# Patient Record
Sex: Female | Born: 1980 | Race: White | Hispanic: No | Marital: Single | Smoking: Current every day smoker
Health system: Southern US, Community
[De-identification: ages and names within clinical notes are randomized; demographics above are authoritative.]

## PROBLEM LIST (undated history)

## (undated) DIAGNOSIS — D649 Anemia, unspecified: Secondary | ICD-10-CM

## (undated) DIAGNOSIS — Z5189 Encounter for other specified aftercare: Secondary | ICD-10-CM

## (undated) HISTORY — PX: TUBAL LIGATION: SHX77

---
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
## 2008-12-13 ENCOUNTER — Emergency Department (HOSPITAL_BASED_OUTPATIENT_CLINIC_OR_DEPARTMENT_OTHER): Admission: EM | Admit: 2008-12-13 | Discharge: 2008-12-13 | Payer: Self-pay | Admitting: Emergency Medicine

## 2008-12-13 ENCOUNTER — Ambulatory Visit: Payer: Self-pay | Admitting: Diagnostic Radiology

## 2009-08-11 ENCOUNTER — Encounter: Payer: Self-pay | Admitting: Emergency Medicine

## 2009-08-11 ENCOUNTER — Ambulatory Visit: Payer: Self-pay | Admitting: Diagnostic Radiology

## 2009-08-12 ENCOUNTER — Inpatient Hospital Stay (HOSPITAL_COMMUNITY): Admission: EM | Admit: 2009-08-12 | Discharge: 2009-08-16 | Payer: Self-pay | Admitting: Internal Medicine

## 2010-10-03 LAB — BASIC METABOLIC PANEL
CO2: 22 mEq/L (ref 19–32)
Calcium: 8.4 mg/dL (ref 8.4–10.5)
GFR calc Af Amer: >60 mL/min (ref >60)
GFR calc non Af Amer: 54 mL/min — ABNORMAL LOW (ref >60)
Glucose, Bld: 174 mg/dL — ABNORMAL HIGH (ref 70–99)

## 2010-10-03 LAB — CBC
HCT: 32.9 % — ABNORMAL LOW (ref 36.0–46.0)
Hemoglobin: 11.2 g/dL — ABNORMAL LOW (ref 12.0–15.0)
Platelets: 192 10*3/uL (ref 150–400)
RBC: 3.87 MIL/uL (ref 3.87–5.11)
WBC: 15 10*3/uL — ABNORMAL HIGH (ref 4.0–10.5)

## 2010-10-03 LAB — DIFFERENTIAL
Basophils Relative: 0 % (ref 0–1)
Eosinophils Absolute: 0 10*3/uL (ref 0.0–0.7)
Monocytes Absolute: 0.8 10*3/uL (ref 0.1–1.0)
Neutro Abs: 13.4 10*3/uL — ABNORMAL HIGH (ref 1.7–7.7)

## 2010-10-03 LAB — URINALYSIS, ROUTINE W REFLEX MICROSCOPIC
Glucose, UA: 100 mg/dL — AB
Ketones, ur: NEGATIVE mg/dL
Protein, ur: 100 mg/dL — AB
pH: 6 (ref 5.0–8.0)

## 2010-10-03 LAB — PREGNANCY, URINE: Preg Test, Ur: NEGATIVE

## 2010-10-03 LAB — URINE MICROSCOPIC-ADD ON

## 2010-10-04 LAB — COMPREHENSIVE METABOLIC PANEL
ALT: 13 U/L (ref 0–35)
ALT: 14 U/L (ref 0–35)
AST: 15 U/L (ref 0–37)
Alkaline Phosphatase: 63 U/L (ref 39–117)
BUN: 5 mg/dL — ABNORMAL LOW (ref 6–23)
Calcium: 8.2 mg/dL — ABNORMAL LOW (ref 8.4–10.5)
Chloride: 107 mEq/L (ref 96–112)
Chloride: 109 mEq/L (ref 96–112)
Creatinine, Ser: 0.65 mg/dL (ref 0.4–1.2)
Creatinine, Ser: 0.68 mg/dL (ref 0.4–1.2)
GFR calc Af Amer: >60 mL/min (ref >60)
GFR calc non Af Amer: >60 mL/min (ref >60)
Glucose, Bld: 87 mg/dL (ref 70–99)
Potassium: 2.8 mEq/L — ABNORMAL LOW (ref 3.5–5.1)
Potassium: 4.1 mEq/L (ref 3.5–5.1)
Total Protein: 5.7 g/dL — ABNORMAL LOW (ref 6.0–8.3)

## 2010-10-04 LAB — PHOSPHORUS
Phosphorus: 1.7 mg/dL — ABNORMAL LOW (ref 2.3–4.6)
Phosphorus: 3.4 mg/dL (ref 2.3–4.6)

## 2010-10-04 LAB — CBC
HCT: 28.1 % — ABNORMAL LOW (ref 36.0–46.0)
Hemoglobin: 9.1 g/dL — ABNORMAL LOW (ref 12.0–15.0)
RBC: 3.25 MIL/uL — ABNORMAL LOW (ref 3.87–5.11)
RDW: 15.6 % — ABNORMAL HIGH (ref 11.5–15.5)

## 2010-10-04 LAB — BASIC METABOLIC PANEL
CO2: 21 mEq/L (ref 19–32)
Calcium: 7.9 mg/dL — ABNORMAL LOW (ref 8.4–10.5)
Creatinine, Ser: 0.71 mg/dL (ref 0.4–1.2)

## 2010-10-04 LAB — MAGNESIUM: Magnesium: 1.9 mg/dL (ref 1.5–2.5)

## 2010-10-04 LAB — CULTURE, RESPIRATORY W GRAM STAIN

## 2010-10-04 LAB — CULTURE, BLOOD (ROUTINE X 2)

## 2010-10-04 LAB — EXPECTORATED SPUTUM ASSESSMENT W GRAM STAIN, RFLX TO RESP C

## 2010-10-26 LAB — DIFFERENTIAL
Eosinophils Absolute: 0.1 10*3/uL (ref 0.0–0.7)
Lymphocytes Relative: 36 % (ref 12–46)
Lymphs Abs: 2.6 10*3/uL (ref 0.7–4.0)
Monocytes Absolute: 0.7 10*3/uL (ref 0.1–1.0)
Neutro Abs: 3.8 10*3/uL (ref 1.7–7.7)
Neutrophils Relative %: 52 % (ref 43–77)

## 2010-10-26 LAB — BASIC METABOLIC PANEL
BUN: 12 mg/dL (ref 6–23)
Calcium: 8.7 mg/dL (ref 8.4–10.5)
Chloride: 107 mEq/L (ref 96–112)
GFR calc Af Amer: >60 mL/min (ref >60)
GFR calc non Af Amer: >60 mL/min (ref >60)

## 2010-10-26 LAB — CBC
Hemoglobin: 10.8 g/dL — ABNORMAL LOW (ref 12.0–15.0)
MCV: 85.9 fL (ref 78.0–100.0)
RDW: 16 % — ABNORMAL HIGH (ref 11.5–15.5)
WBC: 7.4 10*3/uL (ref 4.0–10.5)

## 2010-10-26 LAB — POCT CARDIAC MARKERS

## 2015-12-13 ENCOUNTER — Emergency Department (HOSPITAL_COMMUNITY)
Admission: EM | Admit: 2015-12-13 | Discharge: 2015-12-13 | Disposition: A | Discharge status: Home / Self Care | Payer: Medicaid Other | Attending: Emergency Medicine | Admitting: Emergency Medicine

## 2015-12-13 ENCOUNTER — Encounter (HOSPITAL_COMMUNITY): Payer: Self-pay | Admitting: *Deleted

## 2015-12-13 ENCOUNTER — Observation Stay (HOSPITAL_COMMUNITY)
Admission: AD | Admit: 2015-12-13 | Discharge: 2015-12-14 | Disposition: A | Discharge status: Home / Self Care | Payer: Medicaid Other | Attending: Medical | Admitting: Medical

## 2015-12-13 ENCOUNTER — Emergency Department (HOSPITAL_COMMUNITY): Payer: Medicaid Other

## 2015-12-13 ENCOUNTER — Encounter (HOSPITAL_COMMUNITY): Payer: Self-pay

## 2015-12-13 DIAGNOSIS — M25551 Pain in right hip: Secondary | ICD-10-CM

## 2015-12-13 DIAGNOSIS — Y999 Unspecified external cause status: Secondary | ICD-10-CM | POA: Insufficient documentation

## 2015-12-13 DIAGNOSIS — G8929 Other chronic pain: Secondary | ICD-10-CM | POA: Diagnosis not present

## 2015-12-13 DIAGNOSIS — F17210 Nicotine dependence, cigarettes, uncomplicated: Secondary | ICD-10-CM | POA: Insufficient documentation

## 2015-12-13 DIAGNOSIS — W19XXXA Unspecified fall, initial encounter: Secondary | ICD-10-CM

## 2015-12-13 DIAGNOSIS — Y939 Activity, unspecified: Secondary | ICD-10-CM | POA: Diagnosis not present

## 2015-12-13 DIAGNOSIS — F1414 Cocaine abuse with cocaine-induced mood disorder: Secondary | ICD-10-CM | POA: Diagnosis not present

## 2015-12-13 DIAGNOSIS — F411 Generalized anxiety disorder: Secondary | ICD-10-CM | POA: Insufficient documentation

## 2015-12-13 DIAGNOSIS — M545 Low back pain: Secondary | ICD-10-CM | POA: Diagnosis not present

## 2015-12-13 DIAGNOSIS — F1114 Opioid abuse with opioid-induced mood disorder: Secondary | ICD-10-CM | POA: Insufficient documentation

## 2015-12-13 DIAGNOSIS — W010XXA Fall on same level from slipping, tripping and stumbling without subsequent striking against object, initial encounter: Secondary | ICD-10-CM | POA: Diagnosis not present

## 2015-12-13 DIAGNOSIS — Y92521 Bus station as the place of occurrence of the external cause: Secondary | ICD-10-CM | POA: Diagnosis not present

## 2015-12-13 DIAGNOSIS — R45851 Suicidal ideations: Secondary | ICD-10-CM | POA: Diagnosis present

## 2015-12-13 DIAGNOSIS — F1994 Other psychoactive substance use, unspecified with psychoactive substance-induced mood disorder: Secondary | ICD-10-CM | POA: Diagnosis present

## 2015-12-13 HISTORY — DX: Anemia, unspecified: D64.9

## 2015-12-13 HISTORY — DX: Encounter for other specified aftercare: Z51.89

## 2015-12-13 LAB — POC URINE PREG, ED: Preg Test, Ur: NEGATIVE

## 2015-12-13 MED ORDER — NAPROXEN 500 MG PO TABS: 500.0000 mg | ORAL_TABLET | Freq: Two times a day (BID) | ORAL | Status: DC

## 2015-12-13 MED ORDER — GABAPENTIN 300 MG PO CAPS
300.0000 mg | ORAL_CAPSULE | Freq: Three times a day (TID) | ORAL | Status: DC
  Administered 2015-12-13 – 2015-12-14 (×3): 300 mg via ORAL
  Filled 2015-12-13 (×3): qty 1

## 2015-12-13 MED ORDER — TRAZODONE HCL 100 MG PO TABS
100.0000 mg | ORAL_TABLET | Freq: Every day | ORAL | Status: DC
  Administered 2015-12-13: 100 mg via ORAL
  Filled 2015-12-13: qty 1

## 2015-12-13 MED ORDER — HYDROXYZINE HCL 25 MG PO TABS
25.0000 mg | ORAL_TABLET | Freq: Four times a day (QID) | ORAL | Status: DC | PRN
  Administered 2015-12-13: 25 mg via ORAL
  Filled 2015-12-13: qty 1

## 2015-12-13 MED ORDER — HYDROCODONE-ACETAMINOPHEN 5-325 MG PO TABS
1.0000 | ORAL_TABLET | Freq: Once | ORAL | Status: AC
  Administered 2015-12-13: 1 via ORAL
  Filled 2015-12-13: qty 1
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
## 2015-12-13 NOTE — ED Notes (Signed)
PT DISCHARGED. INSTRUCTIONS AND PRESCRIPTION GIVEN. AAOX4. PT IN NO APPARENT DISTRESS. THE OPPORTUNITY TO ASK QUESTIONS WAS PROVIDED. 

## 2015-12-13 NOTE — ED Notes (Signed)
Bed: [id number] Expected date: 12/13/15 Expected time: 12:44 PM Means of arrival: Ambulance Comments: 35 yo Slip, fall, hip pain, ambulatory on scene
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
## 2015-12-13 NOTE — Discharge Instructions (Signed)
Cryotherapy °Cryotherapy means treatment with cold. Ice or gel packs can be used to reduce both pain and swelling. Ice is the most helpful within the first 24 to 48 hours after an injury or flare-up from overusing a muscle or joint. Sprains, strains, spasms, burning pain, shooting pain, and aches can all be eased with ice. Ice can also be used when recovering from surgery. Ice is effective, has very few side effects, and is safe for most people to use. °PRECAUTIONS  °Ice is not a safe treatment option for people with: °· Raynaud phenomenon. This is a condition affecting small blood vessels in the extremities. Exposure to cold may cause your problems to return. °· Cold hypersensitivity. There are many forms of cold hypersensitivity, including: °¨ Cold urticaria. Red, itchy hives appear on the skin when the tissues begin to warm after being iced. °¨ Cold erythema. This is a red, itchy rash caused by exposure to cold. °¨ Cold hemoglobinuria. Red blood cells break down when the tissues begin to warm after being iced. The hemoglobin that carry oxygen are passed into the urine because they cannot combine with blood proteins fast enough. °· Numbness or altered sensitivity in the area being iced. °If you have any of the following conditions, do not use ice until you have discussed cryotherapy with your caregiver: °· Heart conditions, such as arrhythmia, angina, or chronic heart disease. °· High blood pressure. °· Healing wounds or open skin in the area being iced. °· Current infections. °· Rheumatoid arthritis. °· Poor circulation. °· Diabetes. °Ice slows the blood flow in the region it is applied. This is beneficial when trying to stop inflamed tissues from spreading irritating chemicals to surrounding tissues. However, if you expose your skin to cold temperatures for too long or without the proper protection, you can damage your skin or nerves. Watch for signs of skin damage due to cold. °HOME CARE INSTRUCTIONS °Follow  these tips to use ice and cold packs safely. °· Place a dry or damp towel between the ice and skin. A damp towel will cool the skin more quickly, so you may need to shorten the time that the ice is used. °· For a more rapid response, add gentle compression to the ice. °· Ice for no more than 10 to 20 minutes at a time. The bonier the area you are icing, the less time it will take to get the benefits of ice. °· Check your skin after 5 minutes to make sure there are no signs of a poor response to cold or skin damage. °· Rest 20 minutes or more between uses. °· Once your skin is numb, you can end your treatment. You can test numbness by very lightly touching your skin. The touch should be so light that you do not see the skin dimple from the pressure of your fingertip. When using ice, most people will feel these normal sensations in this order: cold, burning, aching, and numbness. °· Do not use ice on someone who cannot communicate their responses to pain, such as small children or people with dementia. °HOW TO MAKE AN ICE PACK °Ice packs are the most common way to use ice therapy. Other methods include ice massage, ice baths, and cryosprays. Muscle creams that cause a cold, tingly feeling do not offer the same benefits that ice offers and should not be used as a substitute unless recommended by your caregiver. °To make an ice pack, do one of the following: °· Place crushed ice or a   bag of frozen vegetables in a sealable plastic bag. Squeeze out the excess air. Place this bag inside another plastic bag. Slide the bag into a pillowcase or place a damp towel between your skin and the bag.  Mix 3 parts water with 1 part rubbing alcohol. Freeze the mixture in a sealable plastic bag. When you remove the mixture from the freezer, it will be slushy. Squeeze out the excess air. Place this bag inside another plastic bag. Slide the bag into a pillowcase or place a damp towel between your skin and the bag. SEEK MEDICAL CARE  IF:  You develop white spots on your skin. This may give the skin a blotchy (mottled) appearance.  Your skin turns blue or pale.  Your skin becomes waxy or hard.  Your swelling gets worse. MAKE SURE YOU:   Understand these instructions.  Will watch your condition.  Will get help right away if you are not doing well or get worse.   This information is not intended to replace advice given to you by your health care provider. Make sure you discuss any questions you have with your health care provider.   Document Released: 02/28/2011 Document Revised: 07/25/2014 Document Reviewed: 02/28/2011 Elsevier Interactive Patient Education 2016 Else[employer]  Hip Pain Your hip is the joint between your upper legs and your lower pelvis. The bones, cartilage, tendons, and muscles of your hip joint perform a lot of work each day supporting your body weight and allowing you to move around. Hip pain can range from a minor ache to severe pain in one or both of your hips. Pain may be felt on the inside of the hip joint near the groin, or the outside near the buttocks and upper thigh. You may have swelling or stiffness as well.  HOME CARE INSTRUCTIONS   Take medicines only as directed by your health care provider.  Apply ice to the injured area:  Put ice in a plastic bag.  Place a towel between your skin and the bag.  Leave the ice on for 15-20 minutes at a time, 3-4 times a day.  Keep your leg raised (elevated) when possible to lessen swelling.  Avoid activities that cause pain.  Follow specific exercises as directed by your health care provider.  Sleep with a pillow between your legs on your most comfortable side.  Record how often you have hip pain, the location of the pain, and what it feels like. SEEK MEDICAL CARE IF:   You are unable to put weight on your leg.  Your hip is red or swollen or very tender to touch.  Your pain or swelling continues or worsens after 1 week.  You  have increasing difficulty walking.  You have a fever. SEEK IMMEDIATE MEDICAL CARE IF:   You have fallen.  You have a sudden increase in pain and swelling in your hip. MAKE SURE YOU:   Understand these instructions.  Will watch your condition.  Will get help right away if you are not doing well or get worse.   This information is not intended to replace advice given to you by your health care provider. Make sure you discuss any questions you have with your health care provider.   Follow up with your primary care provider if your symptoms do not improve. Apply ice to affected area. Take Naprosyn or ibuprofen as needed for pain. Return to the ED if you experience severe worsening of your symptoms, weakness, fevers, chill, redness or swelling around  your hip, bowel or bladder incontinence, numbness/tingling in both of your lower extremities.
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
## 2015-12-14 DIAGNOSIS — F1414 Cocaine abuse with cocaine-induced mood disorder: Secondary | ICD-10-CM | POA: Diagnosis not present

## 2015-12-14 DIAGNOSIS — F1994 Other psychoactive substance use, unspecified with psychoactive substance-induced mood disorder: Secondary | ICD-10-CM | POA: Diagnosis not present

## 2015-12-14 LAB — CBC
HCT: 33.2 % — ABNORMAL LOW (ref 36.0–46.0)
Hemoglobin: 10.8 g/dL — ABNORMAL LOW (ref 12.0–15.0)
MCH: 27.8 pg (ref 26.0–34.0)
MCHC: 32.5 g/dL (ref 30.0–36.0)
MCV: 85.6 fL (ref 78.0–100.0)
PLATELETS: 341 10*3/uL (ref 150–400)
RBC: 3.88 MIL/uL (ref 3.87–5.11)
RDW: 16.3 % — AB (ref 11.5–15.5)
WBC: 5.8 10*3/uL (ref 4.0–10.5)

## 2015-12-14 LAB — COMPREHENSIVE METABOLIC PANEL
ALT: 753 U/L — AB (ref 14–54)
AST: 685 U/L — ABNORMAL HIGH (ref 15–41)
Albumin: 3.1 g/dL — ABNORMAL LOW (ref 3.5–5.0)
Alkaline Phosphatase: 245 U/L — ABNORMAL HIGH (ref 38–126)
Anion gap: 7 (ref 5–15)
BUN: 18 mg/dL (ref 6–20)
CHLORIDE: 105 mmol/L (ref 101–111)
CO2: 27 mmol/L (ref 22–32)
CREATININE: 0.87 mg/dL (ref 0.44–1.00)
Calcium: 8.7 mg/dL — ABNORMAL LOW (ref 8.9–10.3)
Glucose, Bld: 139 mg/dL — ABNORMAL HIGH (ref 65–99)
POTASSIUM: 4.1 mmol/L (ref 3.5–5.1)
SODIUM: 139 mmol/L (ref 135–145)
Total Bilirubin: 0.4 mg/dL (ref 0.3–1.2)
Total Protein: 6.4 g/dL — ABNORMAL LOW (ref 6.5–8.1)

## 2015-12-14 MED ORDER — HYDROXYZINE HCL 25 MG PO TABS: 25.0000 mg | ORAL_TABLET | Freq: Four times a day (QID) | ORAL | Status: AC | PRN

## 2015-12-14 MED ORDER — GABAPENTIN 300 MG PO CAPS: 300.0000 mg | ORAL_CAPSULE | Freq: Three times a day (TID) | ORAL | Status: AC

## 2015-12-14 NOTE — Progress Notes (Addendum)
Discharge note:  Patient discharged home per provider instructions.  Patient was given follow up information.  Reviewed AVS/discharge instructions with patient and she indicated understanding.  Patient denies SI/HI/AVH.  [place] [facility] [place].  Patient was also given prescriptions of her medications.  Patient received all personal belongings from locker.  She left ambulatory with cab voucher.

## 2015-12-14 NOTE — Progress Notes (Signed)
This Clinical research associatewriter called and [facility] who also spoke with the patient and will call back at in 15 minutes.      Maryelizabeth Rowanressa Elex Mainwaring, MSW, Clare CharonLCSW, LCAS [facility] Triage Specialist [phone]

## 2015-12-14 NOTE — Progress Notes (Signed)
Patient was served lunch and satisfied.   Buel Molder, MSW, LCSW, LCAS [facility] Triage Specialist [phone] 

## 2015-12-14 NOTE — Progress Notes (Addendum)
This Clinical research associatewriter spoke with the patient to collect collateral information and identify needed resources for a therapeutic disposition.  She reports fleeting suicidal thoughts without a plan and denies homicidal ideations.  Patient denies auditory/visual hallucinations and other self-injurious behaviors.  Patient reports currently wanting to participate with inpatient substance abuse treatment.  She reports her substance use includes: Alcohol- daily, an estimated 12 or more beers, and last drank 2 days ago.  She reports crack cocaine use, an estimated $60 worth a couple times a week, and last using a couple days ago.  She reports heroin use, a various amount, route is injected and last used one month ago.  Patient reports a willingness to following up with [facility] once discharged back home for outpatient services.   This Clinical research associatewriter will make attempts at the following facilities for SA placement.    [facility]-no answer but contact information was given to patient to follow up [facility]- no answer but information was given to patient to follow up  [facility]- spoke with Joni ReiningNicole in intake who confirms availability but labs are needed.  Pt labs was started  [facility]- no answer   **This Clinical research associatewriter was informed that the patient was recently discharged from [facility] where she stayed for 10 days.  Patient has already made contact with [facility] and was instructed to call today at 12pm to check for availability.  Patient reports her intentions [facility] [place] [facility].     Maryelizabeth Rowanressa Mykelle Cockerell, MSW, Clare CharonLCSW, LCAS [facility] Triage Specialist [phone]
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
## 2015-12-14 NOTE — H&P (Signed)
[facility] Provider Admission PAA/H&P  Patient Identification: Tammy Crawford MRN:  [id number] Date of Evaluation:  12/14/2015 Chief Complaint:  SUBSTANCE INDUCED MOOD DISORDER Principal Diagnosis: Substance induced mood disorder ([place]) Diagnosis:   Patient Active Problem List   Diagnosis Date Noted  . Substance induced mood disorder [facility]) [F19.94] 12/13/2015   History of Present Illness:Tammy Crawford is an 35 y.o. female. Pt presents [facility] to [facility] for an [facility]. Per chart review, [facility] d/t slip and fall at bus station. Pt is cooperative and oriented x 4. When asked why pt didn't mention SI at [facility], pt states she felt SI was separate from the slip and fall and didn't realize the ED could've addressed her complaint there. Pt endorses SI with no plan. She reports three prior suicide attempts. She says most recent attempt was last month when she cut her L forearm with a knife. Pt sts she didn't require medical attention. Pt has old lacerations on L forearm. She reports hx of self harm. Pt says her deceased mother had bipolar disorder. Pt reports she was discharged from [facility] on 12/11/15. Pt sts she has [place] the past couple of nights and has nowhere to stay. Pt reports she drinks approximately twelve to eighteen 12 oz beers daily, and she drank 36 oz beer last night. Pt sts she used approx $65 crack cocaine 3 x weekly until last use in April 2017. She reports she injected heroin every weekend for one month starting in Feb 2017 until she accidentally overdosed on heroin in March 2017. Pt endorses fatigue, insomnia, poor appetite with unknown weight loss, guilt, worthlessness, isolating and loss of interest in usual pleasures. Pt sts she has 3 daughters (39, 6, & 39). She sts the two youngest ones live with their dad and the older daughter lives with a family friend. Pt has no upcoming court dates. She reports moderate anxiety with most recent  panic attack last Oct. She reports impairment in her short term memory. Pt has also been inpatient for substance abuse at [facility], [facility] in [place], and [facility]. Pt denies HI and denies [facility]. No delusions noted.She reports her sister in prison was addicted to crack and meth.  On Evaluation:Tammy Crawford is awake, alert and oriented X4 , found resting on obvs unit.   Denies suicidal or homicidal ideation. Denies auditory or visual hallucination and does not appear to be responding to internal stimuli. Patient reports a recent [facility]. States she relapsed 2 day after she was discharge and her aunt will not allow her to come home. Patient is requesting another residential treatment facility. Patient reports she is medication compliant without mediation side effects. States her depression 5/10 today due to current stressors. Reports good appetite and resting well.Support, encouragement and reassurance was provided.   Associated Signs/Symptoms: Depression Symptoms:  depressed mood, (Hypo) Manic Symptoms:  Distractibility, Anxiety Symptoms:  Excessive Worry, Psychotic Symptoms:  Hallucinations: None PTSD Symptoms: NA Total Time spent with patient: 30 minutes  Past Psychiatric History: See Above  Is the patient at risk to self? No.  Has the patient been a risk to self in the past 6 months? No.  Has the patient been a risk to self within the distant past? No.  Is the patient a risk to others? No.  Has the patient been a risk to others in the past 6 months? No.  Has the patient been a  risk to others within the distant past? No.   Prior Inpatient Therapy:   Prior Outpatient Therapy:    Alcohol Screening: 1. How often do you have a drink containing alcohol?: 4 or more times a week 2. How many drinks containing alcohol do you have on a typical day when you are drinking?: 10 or more 3. How often do you have six or more drinks on one occasion?: Daily or  almost daily Preliminary Score: 8 4. How often during the last year have you found that you were not able to stop drinking once you had started?: Never 5. How often during the last year have you failed to do what was normally expected from you becasue of drinking?: Less than monthly 6. How often during the last year have you needed a first drink in the morning to get yourself going after a heavy drinking session?: Never 7. How often during the last year have you had a feeling of guilt of remorse after drinking?: Never 8. How often during the last year have you been unable to remember what happened the night before because you had been drinking?: Never 9. Have you or someone else been injured as a result of your drinking?: No 10. Has a relative or friend or a doctor or another health worker been concerned about your drinking or suggested you cut down?: No Alcohol Use Disorder Identification Test Final Score (AUDIT): 13 Brief Intervention: Yes Substance Abuse History in the last 12 months:  Yes.   Consequences of Substance Abuse: NA Previous Psychotropic Medications: YES Psychological Evaluations: YES Past Medical History:  Past Medical History  Diagnosis Date  . Blood transfusion without reported diagnosis   . Anemia     Past Surgical History  Procedure Laterality Date  . Tubal ligation     Family History: History reviewed. No pertinent family history. Family Psychiatric History: See Above Tobacco Screening: _0 [phone])::1)@ Social History:  History  Alcohol Use  . Yes    Comment: OCCASIONAL     History  Drug Use  . Yes  . Special: "Crack" cocaine, Heroin    Additional Social History:    Specify valuables returned: gray necklace, pair of jeans, sneakers, sunglasses pink shirt, purple earrings                      Allergies:  No Known Allergies Lab Results:  Results for orders placed or performed during the hospital encounter of 12/13/15 (from the past 48  hour(s))  CBC     Status: Abnormal   Collection Time: 12/14/15  8:52 AM  Result Value Ref Range   WBC 5.8 4.0 - 10.5 K/uL   RBC 3.88 3.87 - 5.11 MIL/uL   Hemoglobin 10.8 (L) 12.0 - 15.0 g/dL   HCT 33.2 (L) 36.0 - 46.0 %   MCV 85.6 78.0 - 100.0 fL   MCH 27.8 26.0 - 34.0 pg   MCHC 32.5 30.0 - 36.0 g/dL   RDW 16.3 (H) 11.5 - 15.5 %   Platelets 341 150 - 400 K/uL    Comment: Performed at [facility]  Comprehensive metabolic panel     Status: Abnormal   Collection Time: 12/14/15  8:52 AM  Result Value Ref Range   Sodium 139 135 - 145 mmol/L   Potassium 4.1 3.5 - 5.1 mmol/L   Chloride 105 101 - 111 mmol/L   CO2 27 22 - 32 mmol/L   Glucose, Bld 139 (H) 65 - 99 mg/dL  BUN 18 6 - 20 mg/dL   Creatinine, Ser 0.87 0.44 - 1.00 mg/dL   Calcium 8.7 (L) 8.9 - 10.3 mg/dL   Total Protein 6.4 (L) 6.5 - 8.1 g/dL   Albumin 3.1 (L) 3.5 - 5.0 g/dL   AST 685 (H) 15 - 41 U/L   ALT 753 (H) 14 - 54 U/L   Alkaline Phosphatase 245 (H) 38 - 126 U/L   Total Bilirubin 0.4 0.3 - 1.2 mg/dL   GFR calc non Af Amer >60 >60 mL/min   GFR calc Af Amer >60 >60 mL/min    Comment: (NOTE) The eGFR has been calculated using the CKD EPI equation. This calculation has not been validated in all clinical situations. eGFR's persistently <60 mL/min signify possible Chronic Kidney Disease.    Anion gap 7 5 - 15    Comment: Performed at [facility]    Blood Alcohol level:  No results found for: [facility]  Metabolic Disorder Labs:  No results found for: HGBA1C, MPG No results found for: PROLACTIN No results found for: CHOL, TRIG, HDL, CHOLHDL, VLDL, LDLCALC  Current Medications: Current Facility-Administered Medications  Medication Dose Route Frequency Provider Last Rate Last Dose  . gabapentin (NEURONTIN) capsule 300 mg  300 mg Oral TID Hampton Abbot, MD   300 mg at 12/14/15 1157  . hydrOXYzine (ATARAX/VISTARIL) tablet 25 mg  25 mg Oral Q6H PRN Hampton Abbot, MD   25 mg at 12/13/15  2110  . traZODone (DESYREL) tablet 100 mg  100 mg Oral QHS Hampton Abbot, MD   100 mg at 12/13/15 2110   PTA Medications: Prescriptions prior to admission  Medication Sig Dispense Refill Last Dose  . gabapentin (NEURONTIN) 300 MG capsule Take 600 mg by mouth 3 (three) times daily.   Past Week at Unknown time  . lamoTRIgine (LAMICTAL) 25 MG tablet Take 75 mg by mouth 2 (two) times daily.   Past Week at Unknown time  . lurasidone (LATUDA) 40 MG TABS tablet Take 40 mg by mouth daily with breakfast.   Past Week at Unknown time  . traZODone (DESYREL) 50 MG tablet Take 50 mg by mouth at bedtime.   Past Week at Unknown time    Musculoskeletal: Strength & Muscle Tone: within normal limits Gait & Station: normal Patient leans: N/A  Psychiatric Specialty Exam: Physical Exam  Nursing note and vitals reviewed. Constitutional: She is oriented to person, place, and time. She appears well-developed.  Cardiovascular: Regular rhythm.   Musculoskeletal: Normal range of motion.  Neurological: She is alert and oriented to person, place, and time.  Psychiatric: She has a normal mood and affect. Her behavior is normal.    Review of Systems  Psychiatric/Behavioral: Negative for suicidal ideas, hallucinations and substance abuse. Depression: stable. Nervous/anxious: stable.   All other systems reviewed and are negative.   Blood pressure 110/71, pulse 90, temperature 98 F (36.7 C), temperature source Oral, resp. rate 16, height _0  (1.6 m), weight 83.008 kg (183 lb), last menstrual period 12/06/2015, SpO2 100 %.Body mass index is 32.43 kg/(m^2).   General Appearance: Casual  Eye Contact:  Good  Speech:  Clear and Coherent  Volume:  Normal  Mood:  Euthymic  Affect:  Congruent  Thought Process:  Coherent  Orientation:  Full (Time, Place, and Person)  Thought Content:  Hallucinations: None  Suicidal Thoughts:  No  Homicidal Thoughts:  No  Memory:  Immediate;   Fair Remote;   Fair  Judgement:   Fair  Insight:  Good  Psychomotor Activity:  Normal  Concentration:  Concentration: Fair  Recall:  Good  Fund of Knowledge:  Fair  Language:  Good  Akathisia:  No  Handed:  Right  AIMS (if indicated):     Assets:  Communication Skills Desire for Improvement Resilience  ADL's:  Intact  Cognition:  WNL  Sleep:        I agree with current treatment plan on 12/14/2015, Patient seen face-to-face for psychiatric evaluation follow-up, chart reviewed and case discussed with the MD Dwyane Dee and Treatment team. Reviewed the information documented and agree with the treatment plan.See discharge Note-Provided with additional resources -Patient provided Taxi voucher to [facility] in [place]   Treatment Plan Summary: Daily contact with patient to assess and evaluate symptoms and progress in treatment and Medication management Continue Neurontin 300 mg PO TID Continue Lamictal 25 mg po bid Continue Trazodone 100 MG PO QHS for insomnia   Observation Level/Precautions:  Continuous Observation Laboratory:  CBC Chemistry Profile UDS UA Labs collected this morning for TTS placement. Psychotherapy:  Psychiatry  Medications:  Continue current home medication  Consultations:   Discharge Concerns:  Safety, stabilization, and risk of access to medication and medication stabilization  Estimated LOS: Less than 48 hours Other:   Derrill [place], NP 5/29/201712:40 PM

## 2015-12-14 NOTE — Progress Notes (Signed)
[facility] INPATIENT:  Family/Significant Other Suicide Prevention Education  Suicide Prevention Education:  Patient Refusal for Family/Significant Other Suicide Prevention Education: The patient Tammy Crawford has refused to provide written consent for family/significant other to be provided Family/Significant Other Suicide Prevention Education during admission and/or prior to discharge.  Physician notified.  Glenice LaineIbekwe, Mateus Rewerts B 12/14/2015, 1:19 AM

## 2015-12-14 NOTE — Discharge Instructions (Signed)
Patient will follow up with the following agencies upon discharge:  [facility]  [address] [place], [place]   [facility]/15/2015@10 :00am  [facility] [address] [place], [place] [phone]

## 2015-12-14 NOTE — Progress Notes (Signed)
Discharge Disposition:  [facility] will [facility] [place] and follow up on 12/15/2015 @10 :30am for [facility].      Maryelizabeth Rowanressa Lesta Limbert, MSW, Clare CharonLCSW, LCAS [facility] T[facility] Specialist [phone]

## 2017-05-08 ENCOUNTER — Ambulatory Visit (HOSPITAL_COMMUNITY): Payer: Self-pay | Admitting: Psychiatry

## 2017-08-20 IMAGING — CR DG LUMBAR SPINE COMPLETE 4+V
5 series · 5 of 5 positions shown · non-contrast
Comparison: None.

CLINICAL DATA: Low back and right hip pain after [place] bathroom after slipping on water.

EXAM:
LUMBAR SPINE - COMPLETE 4+ VIEW

[t lumbar spine ap]
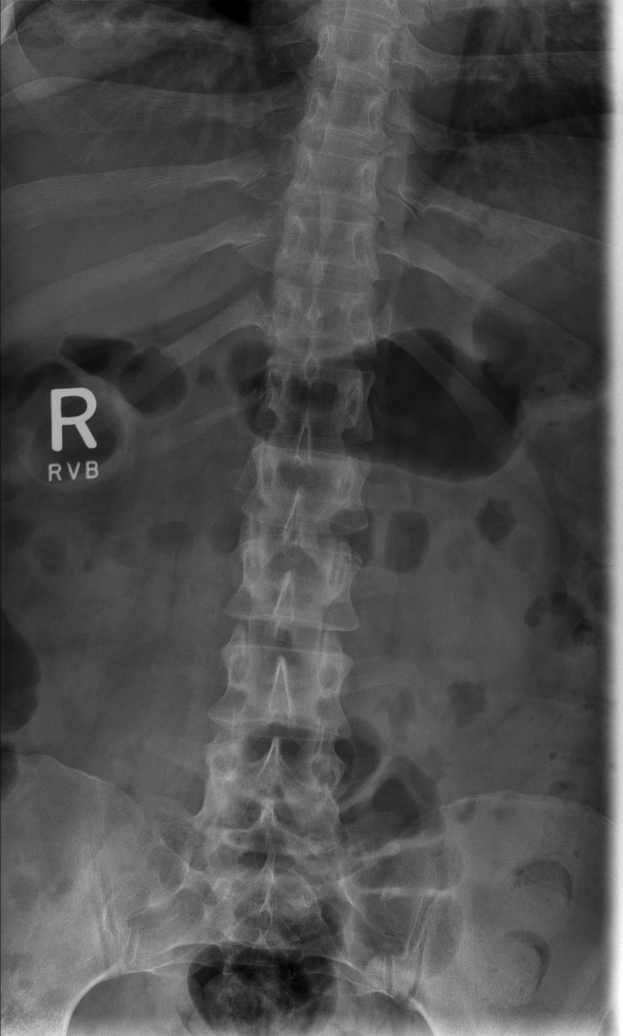

[t lumbar spine obl (1 of 2)]
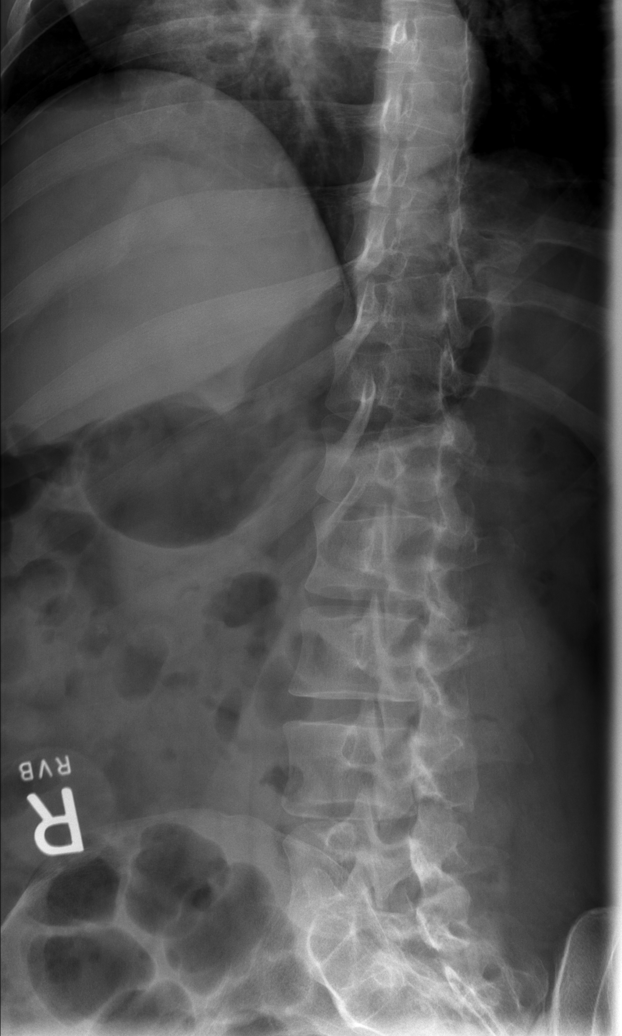

[t lumbar spine obl (2 of 2)]
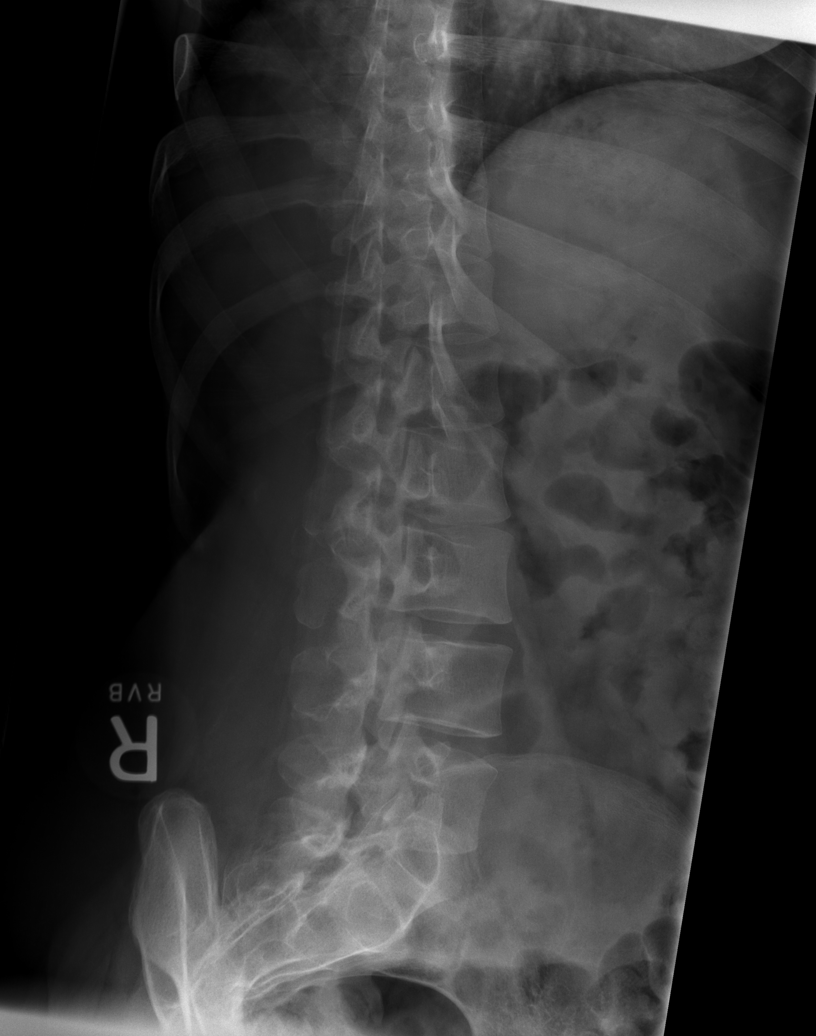

[t lumbar spine lat]
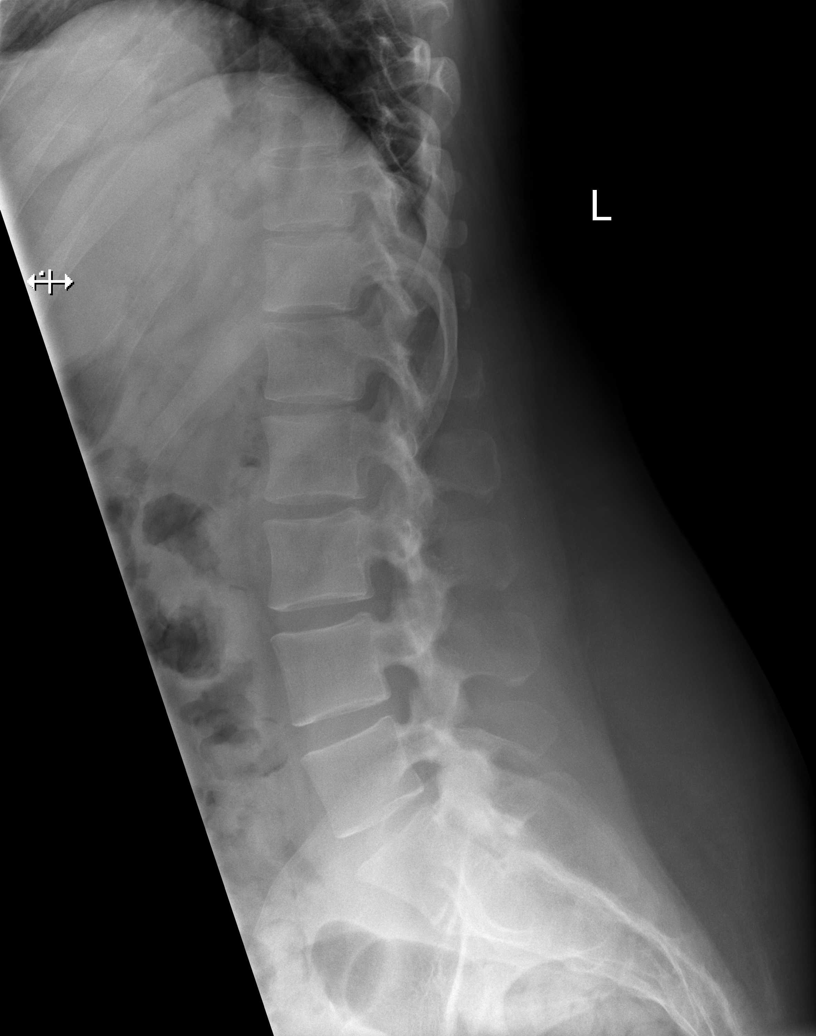

[t lumbar l-5 s-1 spot]
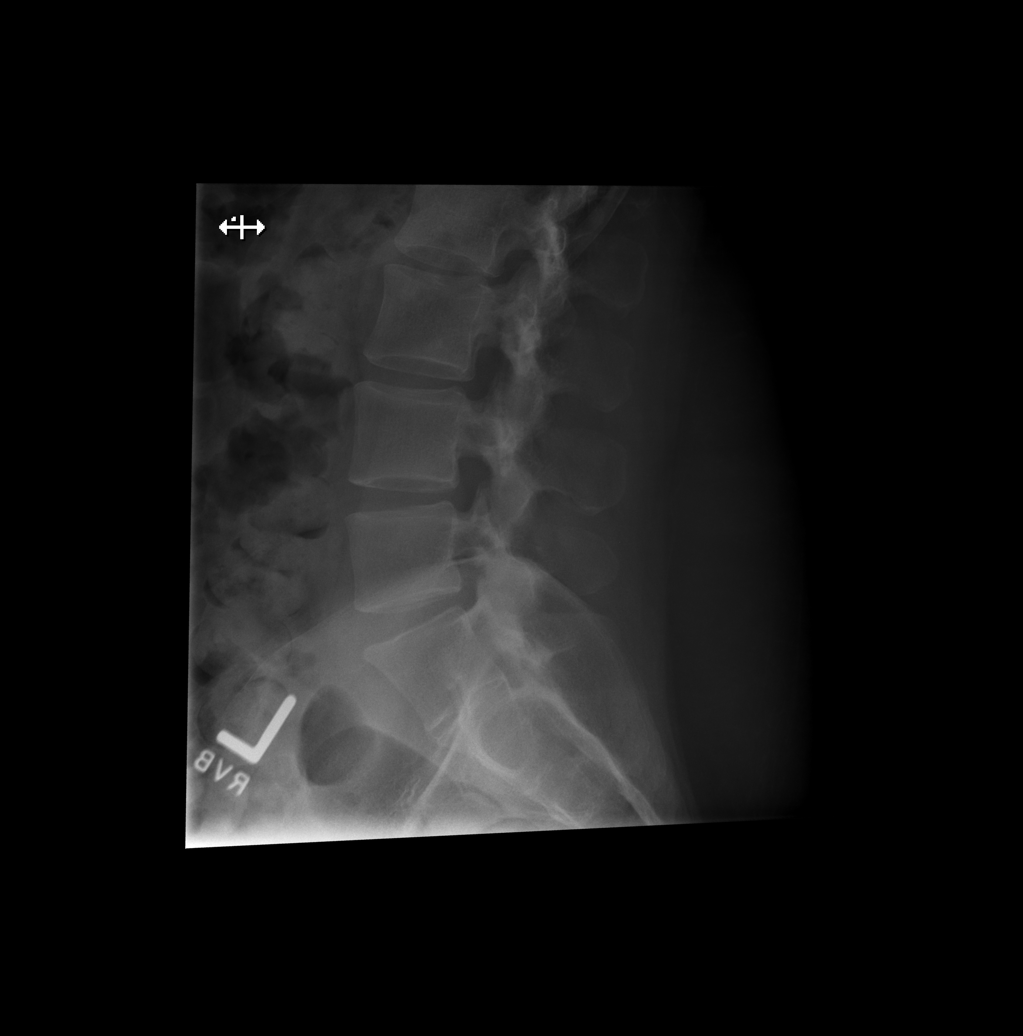

[5 of 5 positions shown; findings below may reference images not displayed]

FINDINGS: There is no evidence of lumbar spine fracture. Alignment is normal.
Intervertebral disc spaces are maintained.
IMPRESSION: Normal examination.

## 2020-01-06 ENCOUNTER — Ambulatory Visit (HOSPITAL_COMMUNITY): Payer: Medicaid Other | Admitting: Clinical
